# Patient Record
Sex: Female | Born: 1965 | Race: White | Hispanic: No | Marital: Married | Smoking: Never smoker
Health system: Southern US, Community
[De-identification: ages and names within clinical notes are randomized; demographics above are authoritative.]

---

## 2001-06-08 ENCOUNTER — Other Ambulatory Visit: Admission: RE | Admit: 2001-06-08 | Discharge: 2001-06-08 | Payer: Self-pay | Admitting: Obstetrics and Gynecology

## 2002-06-14 ENCOUNTER — Other Ambulatory Visit: Admission: RE | Admit: 2002-06-14 | Discharge: 2002-06-14 | Payer: Self-pay | Admitting: Obstetrics and Gynecology

## 2002-08-09 ENCOUNTER — Ambulatory Visit (HOSPITAL_COMMUNITY): Admission: RE | Admit: 2002-08-09 | Discharge: 2002-08-09 | Payer: Self-pay | Admitting: Obstetrics and Gynecology

## 2002-08-09 ENCOUNTER — Encounter: Payer: Self-pay | Admitting: Obstetrics and Gynecology

## 2002-09-08 ENCOUNTER — Ambulatory Visit (HOSPITAL_COMMUNITY): Admission: RE | Admit: 2002-09-08 | Discharge: 2002-09-08 | Payer: Self-pay | Admitting: Obstetrics and Gynecology

## 2002-09-08 ENCOUNTER — Encounter: Payer: Self-pay | Admitting: Obstetrics and Gynecology

## 2002-12-21 ENCOUNTER — Inpatient Hospital Stay (HOSPITAL_COMMUNITY): Admission: RE | Admit: 2002-12-21 | Discharge: 2002-12-21 | Payer: Self-pay | Admitting: Obstetrics and Gynecology

## 2002-12-21 ENCOUNTER — Encounter: Payer: Self-pay | Admitting: Obstetrics and Gynecology

## 2002-12-28 ENCOUNTER — Inpatient Hospital Stay (HOSPITAL_COMMUNITY): Admission: AD | Admit: 2002-12-28 | Discharge: 2002-12-30 | Payer: Self-pay | Admitting: Obstetrics and Gynecology

## 2005-03-31 ENCOUNTER — Other Ambulatory Visit: Admission: RE | Admit: 2005-03-31 | Discharge: 2005-03-31 | Payer: Self-pay | Admitting: Obstetrics and Gynecology

## 2010-01-29 ENCOUNTER — Encounter: Admission: RE | Admit: 2010-01-29 | Discharge: 2010-01-29 | Payer: Self-pay | Admitting: Family Medicine

## 2010-01-29 IMAGING — US US SOFT TISSUE HEAD/NECK
1 series · 11 of 11 positions shown · non-contrast
Comparison: None.

CLINICAL DATA: Right posterior cervical adenopathy

ULTRASOUND OF HEAD/NECK SOFT TISSUES
TECHNIQUE: Ultrasound examination of the head and neck soft
tissues was performed in the area of clinical concern.

[Series 1: us soft tissue head/neck · 0.03mm/px · 11 of 11 slices shown]
[im 1/11]
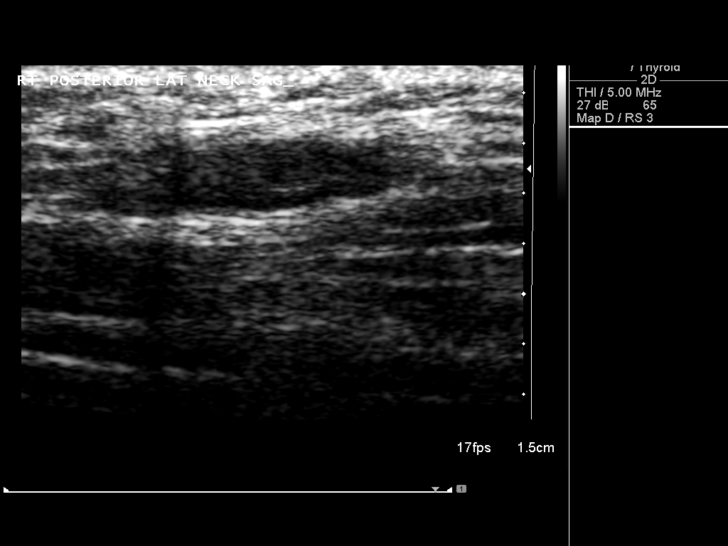
[im 2/11]
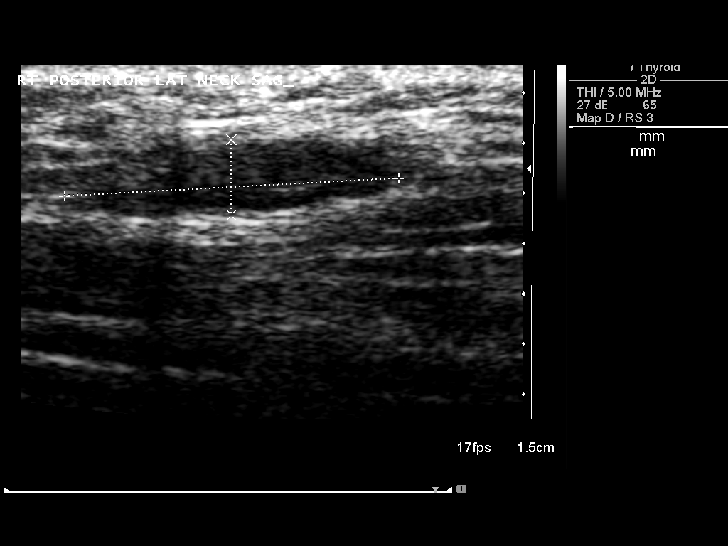
[im 3/11]
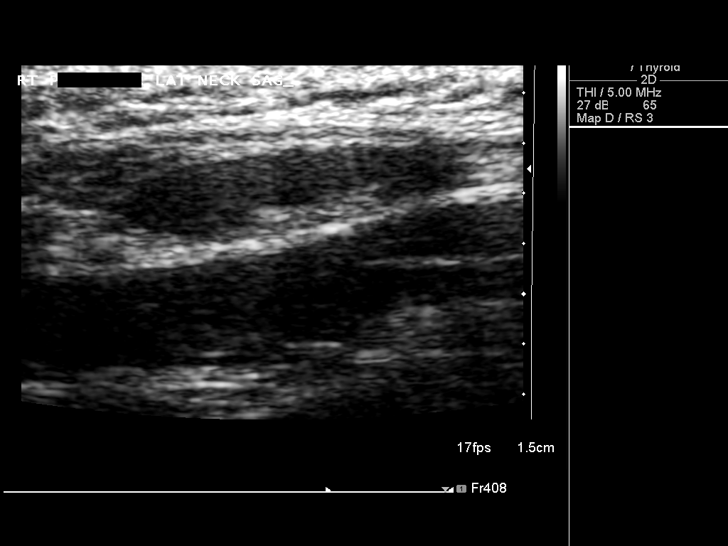
[im 4/11]
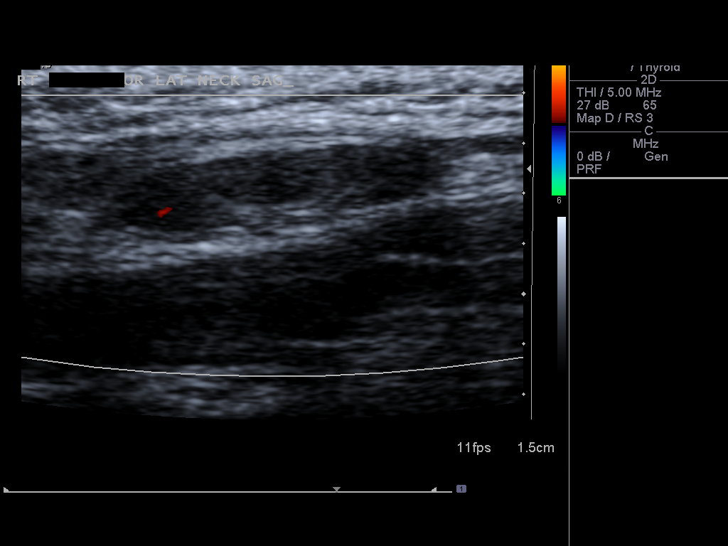
[im 5/11]
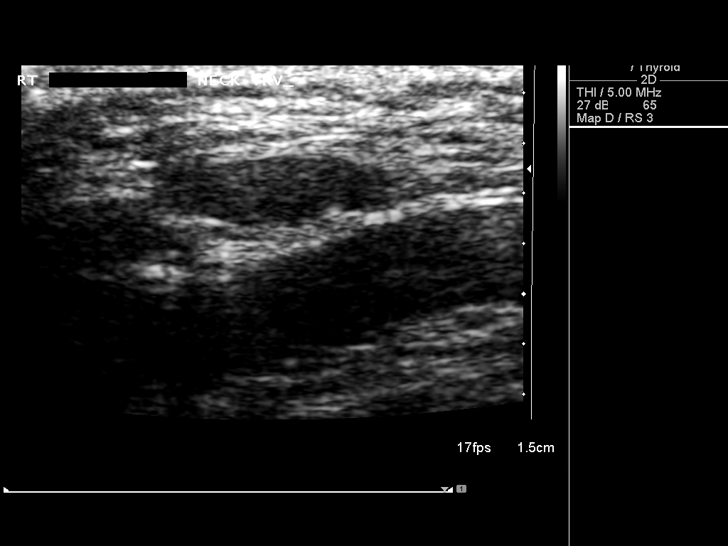
[im 6/11]
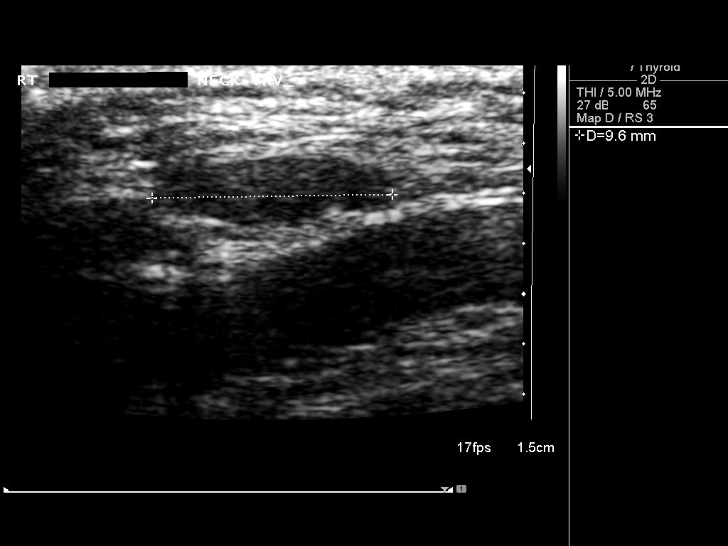
[im 7/11]
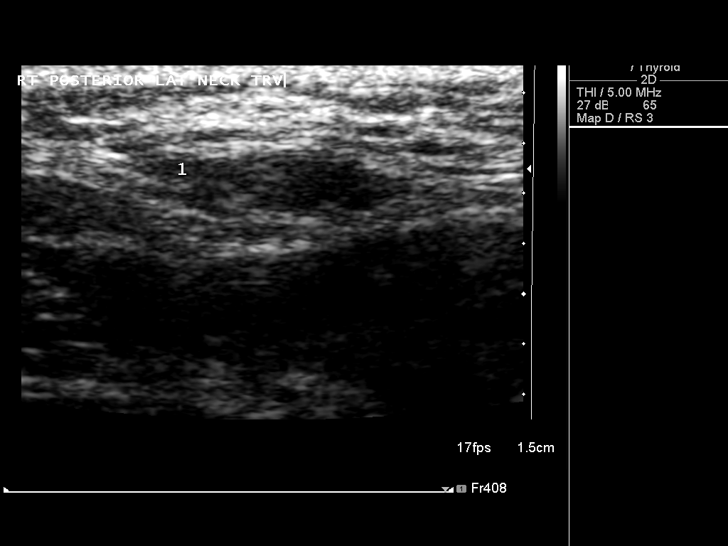
[im 8/11]
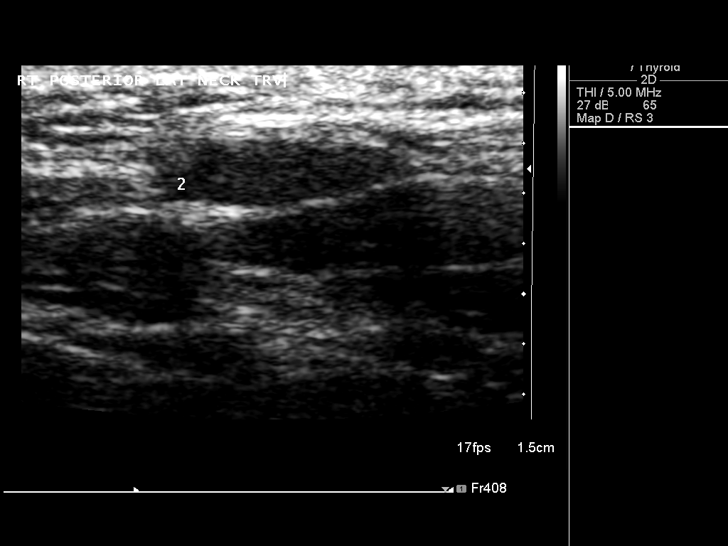
[im 9/11]
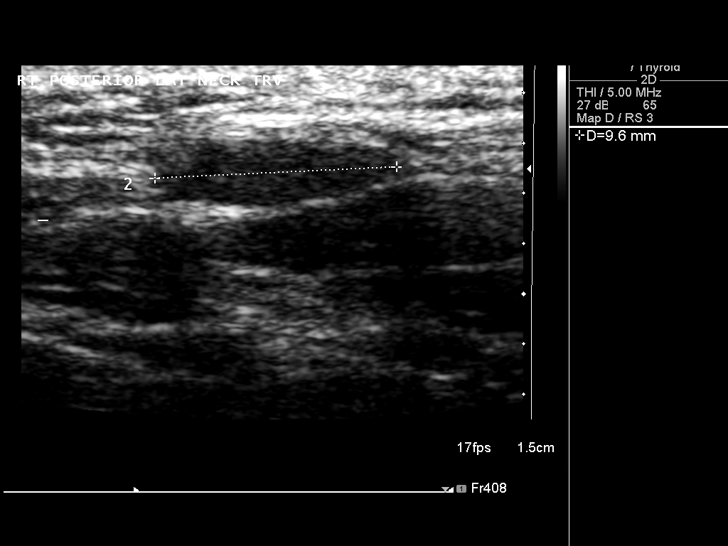
[im 10/11]
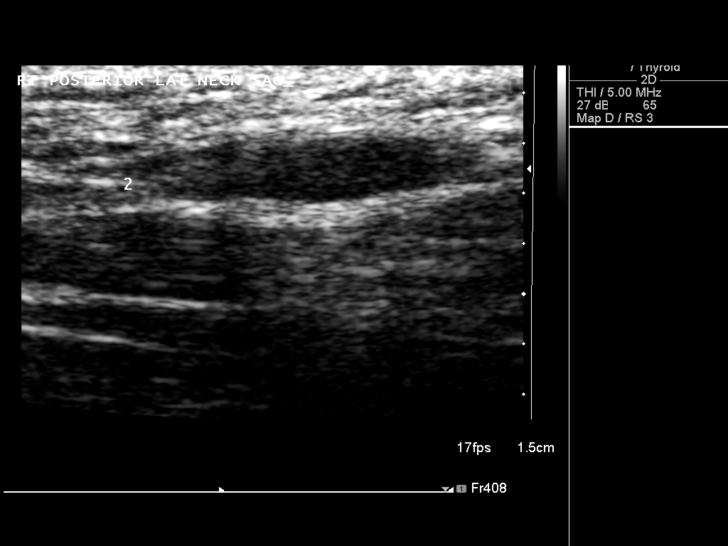
[im 11/11]
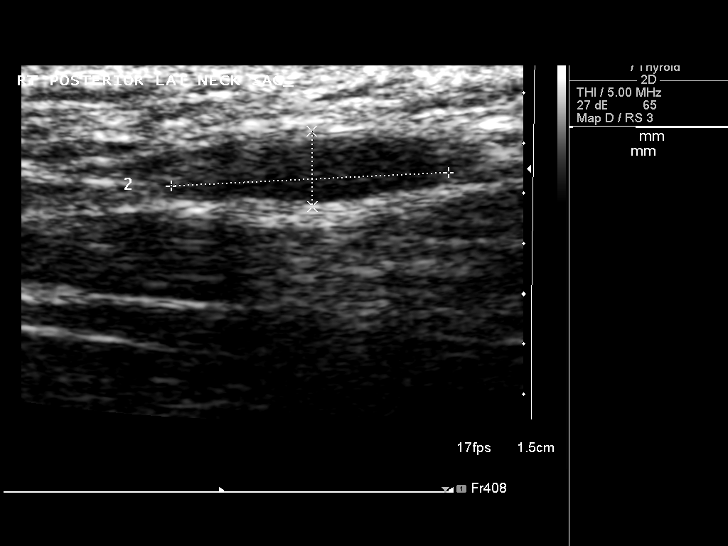

[11 of 11 positions shown; findings below may reference images not displayed]

FINDINGS: Survey ultrasound of the posterolateral right neck
demonstrates two discrete lymph nodes which are morphologically
unremarkable in appearance, measuring 3 x 10 x 13 and 3 x 10 x 11
mm.
IMPRESSION: 1.  Two normal-sized right cervical nodes.

## 2010-08-01 NOTE — H&P (Signed)
NAMEPETRONELLA, Knight                          ACCOUNT NO.:  [id number]   MEDICAL RECORD NO.:  [id number]                   PATIENT TYPE:  INP   LOCATION:  9163                                 FACILITY:  [facility]   PHYSICIAN:  Hal Morales, M.D.             DATE OF BIRTH:  Aug 13, 1965   DATE OF ADMISSION:  12/28/2002  DATE OF DISCHARGE:                                HISTORY & PHYSICAL   Kim Knight is a 45 year old gravida 2, para 1-0-0-1, at 39 weeks, who  presents with spontaneous rupture of membranes at approximately 11 p.m.  tonight with clear fluid noted and irregular mild contractions since then.  The patient's pregnancy has been remarkable for:   1. Advanced maternal age with amniocentesis declined.  2. History of pyelonephritis.  3. History of polyhydramnios.  4. Allergies to KEFLEX and SULFA.  5. Unstable lie in the third trimester, now resolved vertex.    PRENATAL LABORATORY DATA:  Blood type is O positive, Rh antibody negative.  VDRL nonreactive.  Rubella titer positive.  Hepatitis B surface antigen  negative.  Hemoglobin upon entering the practice was 13.5, it was 11.6 at 26  weeks.  Group B strep culture was negative at 36 weeks.  GC and Chlamydia  cultures were negative at [facility] as well as at 36 weeks.  Pap was normal in  March 2004.  AFP was declined.  Glucola was normal.  EDC of January 05, 2003, was established by last menstrual period and was in agreement with  ultrasound at approximately 18 weeks.  She had another ultrasound at 23  weeks to facilitate visualization of the nasal bone, which was viewed  without difficulty.   HISTORY OF PRESENT PREGNANCY:  The patient entered care at approximately 10  weeks.  She declined quadruple screen, CF testing, HIV testing, and  amniocentesis.  She had an ultrasound a 18 weeks that showed normal growth  and development.  They were not able to visualize the nasal bone.  This was  repeated at 23 weeks, and he nasal bone was  seen.  Her Glucola was normal.  At 34-35 weeks she had an oblique lie noted and this persisted until 35  weeks.  Her group B strep culture was negative at 36 weeks.  At 37-5/7 weeks  the patient had an ultrasound secondary to inability to feel position on  Leopold's.  The fetus was transverse with the head on maternal left.  Options were reviewed with the patient, which included (1) C-section, which  the patient declined; (2) external version, which the patient did wish to  consider; and (3) observation with immediate evaluation upon onset of labor  or spontaneous rupture of membranes.  The patient originally elected  version.  She was seen at the hospital on December 21, 2002; however, the  fetus was vertex at that time but then verted to breech during the time of  the  observation and then verted back to vertex.  The decision was made to  defer any manipulation of the fetus.  The patient had a nonstress test at  the office today and another ultrasound, which verified fetus today was in a  vertex presentation.  She had been instructed to call as soon as possible  with onset of labor or spontaneous rupture of membranes, which she did so.   PAST OBSTETRICAL HISTORY:  In April 2000 the patient had a vaginal birth of  a female infant who weighed 8 pounds 10 ounces at 40 weeks.  She was in labor  six to seven hours.  She had epidural anesthesia.  She had no complications.   She reports the usual childhood illnesses.  She has a history of superficial  varicosities.  She has a questionable history of irritable bowel syndrome  but has no current problems.  She had a UTI x1.  She had a UTI which also  moved to a kidney infection, which was treated with oral antibiotics on an  outpatient basis.   The patient is allergic to [facility] and SULFA, which cause rashes.   FAMILY HISTORY:  Her mother has had her varicose veins stripped.  Her mother  also has adult-onset asthma.  Sister had a head injury at age  46 months,  followed by several seizures.  Mother has severe psoriasis.  Maternal  grandmother had lung cancer.  Mother also had depression after divorce.   GENETIC HISTORY:  Remarkable with patient's age of 89.  Father of the baby's  aunt and uncle are twins.   SOCIAL HISTORY:  The patient is married to the father of the baby.  He is  involved and supportive.  His name is Alecea Trego.  The patient is  Caucasian.  She is [place] in ethnicity.  She has a Music therapist.  She  is a Futures trader.  Her husband is also graduate-educated.  He is a Financial risk analyst.  She has been followed by the certified nurse midwife service at  [facility].  She denies any alcohol, drug, or tobacco use during  this pregnancy.   PHYSICAL EXAMINATION:  HEENT:  Within normal limits.  CHEST:  Bilateral breath sounds are clear.  CARDIAC:  Regular rate and rhythm without murmur.  BREASTS:  Soft and nontender.  ABDOMEN:  Fundal height is approximately 40 cm.  Estimated fetal weight is 8  to 8-1/2 pounds.  Uterine contractions are irregular, every two to four  minutes, but very mild.  The patient is leaking copious amounts of clear  fluid.  Fetal heart rate is reactive with no deceleration.  PELVIC:  Cervical exam 3-4 cm, 75%, vertex at a -2 station but well-applied.  EXTREMITIES:  Deep tendon reflexes are 2+ without clonus.  There is a trace  edema noted.   IMPRESSION:  1. Intrauterine pregnancy at 39 weeks.  2. History of unstable lie with fetus now in a well-applied vertex position.  3. Polyhydramnios.  4. Spontaneous rupture of membranes with early labor.  5. Negative group B Streptococcus.   PLAN:  1. Admit to birthing suite per consult with Hal Morales, M.D., as     attending physician.  2. Routine certified nurse midwife orders.  3. Will plan observation at present per patient request.  4. The patient will desire epidural should she require pain medication.    Kim Knight  Kim Knight, C.N.M.  Hal Morales, M.D.    Kim Knight  D:  12/29/2002  T:  12/29/2002  Job:  [id number]

## 2012-06-08 ENCOUNTER — Other Ambulatory Visit: Payer: Self-pay | Admitting: Obstetrics and Gynecology

## 2012-06-08 LAB — FOLLICLE STIMULATING HORMONE: FSH: 5.6 m[IU]/mL

## 2012-06-09 LAB — PAP IG W/ RFLX HPV ASCU

## 2012-06-10 LAB — HUMAN PAPILLOMAVIRUS, HIGH RISK: HPV DNA High Risk: NOT DETECTED

## 2012-06-13 ENCOUNTER — Other Ambulatory Visit: Payer: Self-pay | Admitting: Obstetrics and Gynecology

## 2012-06-13 DIAGNOSIS — Z1231 Encounter for screening mammogram for malignant neoplasm of breast: Secondary | ICD-10-CM

## 2012-07-15 ENCOUNTER — Ambulatory Visit
Admission: RE | Admit: 2012-07-15 | Discharge: 2012-07-15 | Disposition: A | Discharge status: Home / Self Care | Payer: BC Managed Care – PPO | Source: Ambulatory Visit | Attending: Obstetrics and Gynecology | Admitting: Obstetrics and Gynecology

## 2012-07-15 DIAGNOSIS — Z1231 Encounter for screening mammogram for malignant neoplasm of breast: Secondary | ICD-10-CM

## 2016-01-16 DIAGNOSIS — F325 Major depressive disorder, single episode, in full remission: Secondary | ICD-10-CM | POA: Diagnosis not present

## 2016-01-28 DIAGNOSIS — R87610 Atypical squamous cells of undetermined significance on cytologic smear of cervix (ASC-US): Secondary | ICD-10-CM | POA: Diagnosis not present

## 2016-01-28 DIAGNOSIS — Z124 Encounter for screening for malignant neoplasm of cervix: Secondary | ICD-10-CM | POA: Diagnosis not present

## 2016-01-28 DIAGNOSIS — Z01419 Encounter for gynecological examination (general) (routine) without abnormal findings: Secondary | ICD-10-CM | POA: Diagnosis not present

## 2016-01-28 DIAGNOSIS — Z1231 Encounter for screening mammogram for malignant neoplasm of breast: Secondary | ICD-10-CM | POA: Diagnosis not present

## 2016-01-28 DIAGNOSIS — Z6832 Body mass index (BMI) 32.0-32.9, adult: Secondary | ICD-10-CM | POA: Diagnosis not present

## 2016-02-04 DIAGNOSIS — Z1211 Encounter for screening for malignant neoplasm of colon: Secondary | ICD-10-CM | POA: Diagnosis not present

## 2016-04-06 DIAGNOSIS — D122 Benign neoplasm of ascending colon: Secondary | ICD-10-CM | POA: Diagnosis not present

## 2016-04-06 DIAGNOSIS — Z1211 Encounter for screening for malignant neoplasm of colon: Secondary | ICD-10-CM | POA: Diagnosis not present

## 2016-04-06 DIAGNOSIS — K6389 Other specified diseases of intestine: Secondary | ICD-10-CM | POA: Diagnosis not present

## 2016-04-06 DIAGNOSIS — K635 Polyp of colon: Secondary | ICD-10-CM | POA: Diagnosis not present

## 2016-04-08 DIAGNOSIS — L0390 Cellulitis, unspecified: Secondary | ICD-10-CM | POA: Diagnosis not present

## 2016-06-14 DIAGNOSIS — N390 Urinary tract infection, site not specified: Secondary | ICD-10-CM | POA: Diagnosis not present

## 2016-07-22 DIAGNOSIS — F325 Major depressive disorder, single episode, in full remission: Secondary | ICD-10-CM | POA: Diagnosis not present

## 2016-07-22 DIAGNOSIS — I83813 Varicose veins of bilateral lower extremities with pain: Secondary | ICD-10-CM | POA: Diagnosis not present

## 2016-07-22 DIAGNOSIS — Z0000 Encounter for general adult medical examination without abnormal findings: Secondary | ICD-10-CM | POA: Diagnosis not present

## 2016-07-22 DIAGNOSIS — Z13220 Encounter for screening for lipoid disorders: Secondary | ICD-10-CM | POA: Diagnosis not present

## 2016-07-27 ENCOUNTER — Other Ambulatory Visit: Payer: Self-pay | Admitting: Vascular Surgery

## 2016-07-27 DIAGNOSIS — I83813 Varicose veins of bilateral lower extremities with pain: Secondary | ICD-10-CM

## 2016-08-10 DIAGNOSIS — N390 Urinary tract infection, site not specified: Secondary | ICD-10-CM | POA: Diagnosis not present

## 2016-09-15 ENCOUNTER — Encounter (HOSPITAL_COMMUNITY): Payer: Self-pay

## 2016-09-15 ENCOUNTER — Encounter: Payer: Self-pay | Admitting: Vascular Surgery

## 2016-10-05 ENCOUNTER — Encounter: Payer: Self-pay | Admitting: Vascular Surgery

## 2016-10-20 ENCOUNTER — Ambulatory Visit (HOSPITAL_COMMUNITY)
Admission: RE | Admit: 2016-10-20 | Discharge: 2016-10-20 | Disposition: A | Discharge status: Home / Self Care | Payer: BLUE CROSS/BLUE SHIELD | Source: Ambulatory Visit | Attending: Vascular Surgery | Admitting: Vascular Surgery

## 2016-10-20 ENCOUNTER — Encounter: Payer: Self-pay | Admitting: Vascular Surgery

## 2016-10-20 ENCOUNTER — Ambulatory Visit (INDEPENDENT_AMBULATORY_CARE_PROVIDER_SITE_OTHER): Payer: BLUE CROSS/BLUE SHIELD | Admitting: Vascular Surgery

## 2016-10-20 VITALS — BP 120/77 | HR 80 | Temp 98.8°F | Resp 16 | Ht 68.0 in | Wt 203.0 lb

## 2016-10-20 DIAGNOSIS — I83813 Varicose veins of bilateral lower extremities with pain: Secondary | ICD-10-CM | POA: Diagnosis not present

## 2016-10-20 DIAGNOSIS — I83899 Varicose veins of unspecified lower extremities with other complications: Secondary | ICD-10-CM | POA: Diagnosis not present

## 2016-10-20 DIAGNOSIS — I872 Venous insufficiency (chronic) (peripheral): Secondary | ICD-10-CM | POA: Diagnosis not present

## 2016-10-20 NOTE — Progress Notes (Signed)
[facility] [place]  Patient name: Kim Knight MRN: [id number] DOB: November 05, 1965 Sex: female  REASON FOR CONSULT: Painful bilateral lower extremities left greater than right.  HPI: Kim Knight is a 51 y.o. female, who is here today for evaluation. She reports a heavy achy sensation in her left leg and much less so in her right leg. She does have an area of raised small varicosities and reticular veins over her left medial calf and reticular veins in her left medial thigh. She reports an achy sensation surrounding the raised of veins as well. No history of DVT. She does have mild lower extremity swelling  History reviewed. No pertinent past medical history.  Family History  Problem Relation Age of Onset  . Varicose Veins Mother   . Hypertension Mother   . Asthma Mother   . Diabetes Father   . Heart disease Father     SOCIAL HISTORY: Social History   Social History  . Marital status: Married    Spouse name: N/A  . Number of children: N/A  . Years of education: N/A   Occupational History  . Not on file.   Social History Main Topics  . Smoking status: Never Smoker  . Smokeless tobacco: Never Used  . Alcohol use 1.8 oz/week    3 Glasses of wine per week  . Drug use: No  . Sexual activity: Not on file   Other Topics Concern  . Not on file   Social History Narrative  . No narrative on file    Allergies  Allergen Reactions  . Keflex [Cephalexin] Rash  . Sulfa Antibiotics Rash    Current Outpatient Prescriptions  Medication Sig Dispense Refill  . escitalopram (LEXAPRO) 10 MG tablet Take 10 mg by mouth daily.     No current facility-administered medications for this visit.     REVIEW OF SYSTEMS:  [X]  denotes positive finding, [ ]  denotes negative finding Cardiac  Comments:  Chest pain or chest pressure:    Shortness of breath upon exertion:    Short of breath when lying flat:    Irregular heart  rhythm:        Vascular    Pain in calf, thigh, or hip brought on by ambulation: x   Pain in feet at night that wakes you up from your sleep:     Blood clot in your veins:    Leg swelling:         Pulmonary    Oxygen at home:    Productive cough:     Wheezing:         Neurologic    Sudden weakness in arms or legs:     Sudden numbness in arms or legs:     Sudden onset of difficulty speaking or slurred speech:    Temporary loss of vision in one eye:     Problems with dizziness:         Gastrointestinal    Blood in stool:     Vomited blood:         Genitourinary    Burning when urinating:     Blood in urine:        Psychiatric    Major depression:         Hematologic    Bleeding problems:    Problems with blood clotting too easily:        Skin    Rashes or ulcers:        Constitutional  Fever or chills:      PHYSICAL EXAM: Vitals:   10/20/16 1455  BP: 120/77  Pulse: 80  Resp: 16  Temp: 98.8 F (37.1 C)  SpO2: 93%  Weight: 203 lb (92.1 kg)  Height: 5\' 8"  (1.727 m)    GENERAL: The patient is a well-nourished female, in no acute distress. The vital signs are documented above. CARDIOVASCULAR: 2+ radial pulses bilaterally. This raises varicosities over her medial calf and reticular veins in her medial and lateral thigh PULMONARY: There is good air exchange  MUSCULOSKELETAL: There are no major deformities or cyanosis. NEUROLOGIC: No focal weakness or paresthesias are detected. SKIN: There are no ulcers or rashes noted. PSYCHIATRIC: The patient has a normal affect.  DATA:  Noninvasive lower from the study from today reveals no evidence of DVT. She has reflux in her right common femoral vein and also reflux in her left common femoral vein, femoral vein and popliteal vein. No significant reflux in her saphenous vein.  MEDICAL ISSUES: I discussed the significance of with his with patient. 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 Explained that the only treatment option for this is elevation and compression. 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 She saw Carney HarderLiz Wert,RN for discussion of potential sclerotherapy. She'll notify us if she wishes to proceed   Larina Earthlyodd F. Gildo Crisco, MD [facility]FACS [facility] Office Tel [phone] Pager [phone]

## 2017-02-15 DIAGNOSIS — Z3040 Encounter for surveillance of contraceptives, unspecified: Secondary | ICD-10-CM | POA: Diagnosis not present

## 2017-07-28 DIAGNOSIS — Z13220 Encounter for screening for lipoid disorders: Secondary | ICD-10-CM | POA: Diagnosis not present

## 2017-07-28 DIAGNOSIS — Z0000 Encounter for general adult medical examination without abnormal findings: Secondary | ICD-10-CM | POA: Diagnosis not present

## 2018-01-25 DIAGNOSIS — F325 Major depressive disorder, single episode, in full remission: Secondary | ICD-10-CM | POA: Diagnosis not present

## 2018-03-15 DIAGNOSIS — Z1231 Encounter for screening mammogram for malignant neoplasm of breast: Secondary | ICD-10-CM | POA: Diagnosis not present

## 2018-03-15 DIAGNOSIS — Z01419 Encounter for gynecological examination (general) (routine) without abnormal findings: Secondary | ICD-10-CM | POA: Diagnosis not present

## 2018-03-15 DIAGNOSIS — Z3040 Encounter for surveillance of contraceptives, unspecified: Secondary | ICD-10-CM | POA: Diagnosis not present

## 2018-03-15 DIAGNOSIS — Z1211 Encounter for screening for malignant neoplasm of colon: Secondary | ICD-10-CM | POA: Diagnosis not present

## 2018-08-22 DIAGNOSIS — F325 Major depressive disorder, single episode, in full remission: Secondary | ICD-10-CM | POA: Diagnosis not present

## 2018-08-22 DIAGNOSIS — Z0000 Encounter for general adult medical examination without abnormal findings: Secondary | ICD-10-CM | POA: Diagnosis not present

## 2019-04-10 DIAGNOSIS — Z1231 Encounter for screening mammogram for malignant neoplasm of breast: Secondary | ICD-10-CM | POA: Diagnosis not present

## 2019-04-20 DIAGNOSIS — Z124 Encounter for screening for malignant neoplasm of cervix: Secondary | ICD-10-CM | POA: Diagnosis not present

## 2019-04-20 DIAGNOSIS — Z3040 Encounter for surveillance of contraceptives, unspecified: Secondary | ICD-10-CM | POA: Diagnosis not present

## 2019-04-20 DIAGNOSIS — Z6835 Body mass index (BMI) 35.0-35.9, adult: Secondary | ICD-10-CM | POA: Diagnosis not present

## 2019-04-20 DIAGNOSIS — Z01419 Encounter for gynecological examination (general) (routine) without abnormal findings: Secondary | ICD-10-CM | POA: Diagnosis not present

## 2022-06-08 ENCOUNTER — Other Ambulatory Visit: Payer: Self-pay | Admitting: Family Medicine

## 2022-06-08 ENCOUNTER — Ambulatory Visit
Admission: RE | Admit: 2022-06-08 | Discharge: 2022-06-08 | Disposition: A | Discharge status: Home / Self Care | Payer: 59 | Source: Ambulatory Visit | Attending: Family Medicine | Admitting: Family Medicine

## 2022-06-08 DIAGNOSIS — R091 Pleurisy: Secondary | ICD-10-CM

## 2022-06-08 NOTE — Progress Notes (Signed)
5 days of progressive pleurisy w/ cough.

## 2023-03-29 ENCOUNTER — Other Ambulatory Visit: Payer: Self-pay | Admitting: Obstetrics and Gynecology

## 2023-03-29 DIAGNOSIS — Z1231 Encounter for screening mammogram for malignant neoplasm of breast: Secondary | ICD-10-CM

## 2024-03-03 ENCOUNTER — Other Ambulatory Visit: Payer: Self-pay | Admitting: Obstetrics and Gynecology

## 2024-03-03 DIAGNOSIS — Z1231 Encounter for screening mammogram for malignant neoplasm of breast: Secondary | ICD-10-CM

## 2024-03-28 ENCOUNTER — Ambulatory Visit
Admission: RE | Admit: 2024-03-28 | Discharge: 2024-03-28 | Disposition: A | Source: Ambulatory Visit | Attending: Obstetrics and Gynecology | Admitting: Obstetrics and Gynecology

## 2024-03-28 DIAGNOSIS — Z1231 Encounter for screening mammogram for malignant neoplasm of breast: Secondary | ICD-10-CM
